# Patient Record
Sex: Female | Born: 1970 | Race: White | Hispanic: No | Marital: Single | Smoking: Never smoker
Health system: Southern US, Community
[De-identification: ages and names within clinical notes are randomized; demographics above are authoritative.]

## PROBLEM LIST (undated history)

## (undated) DIAGNOSIS — E119 Type 2 diabetes mellitus without complications: Secondary | ICD-10-CM

## (undated) DIAGNOSIS — N200 Calculus of kidney: Secondary | ICD-10-CM

## (undated) DIAGNOSIS — F329 Major depressive disorder, single episode, unspecified: Secondary | ICD-10-CM

## (undated) DIAGNOSIS — I10 Essential (primary) hypertension: Secondary | ICD-10-CM

## (undated) DIAGNOSIS — F419 Anxiety disorder, unspecified: Secondary | ICD-10-CM

## (undated) DIAGNOSIS — F32A Depression, unspecified: Secondary | ICD-10-CM

## (undated) HISTORY — DX: Major depressive disorder, single episode, unspecified: F32.9

## (undated) HISTORY — PX: KIDNEY STONE SURGERY: SHX686

## (undated) HISTORY — DX: Anxiety disorder, unspecified: F41.9

## (undated) HISTORY — DX: Depression, unspecified: F32.A

## (undated) HISTORY — PX: DILATION AND CURETTAGE OF UTERUS: SHX78

## (undated) HISTORY — DX: Essential (primary) hypertension: I10

---

## 2013-05-11 ENCOUNTER — Ambulatory Visit: Payer: Self-pay | Admitting: Emergency Medicine

## 2013-05-11 VITALS — BP 162/100 | HR 109 | Temp 100.5°F | Resp 16 | Ht 64.0 in | Wt 167.0 lb

## 2013-05-11 DIAGNOSIS — J111 Influenza due to unidentified influenza virus with other respiratory manifestations: Secondary | ICD-10-CM

## 2013-05-11 MED ORDER — PSEUDOEPHEDRINE-GUAIFENESIN ER 60-600 MG PO TB12: 1.0000 | ORAL_TABLET | Freq: Two times a day (BID) | ORAL | Status: AC

## 2013-05-11 MED ORDER — PROMETHAZINE-CODEINE 6.25-10 MG/5ML PO SYRP: 5.0000 mL | ORAL_SOLUTION | Freq: Four times a day (QID) | ORAL | Status: DC | PRN

## 2013-05-11 NOTE — Patient Instructions (Signed)

## 2013-05-11 NOTE — Progress Notes (Signed)
[facility] [address], [place] [id number] [phone]  Date:  05/11/2013   Name:  Bailey Burke   DOB:  May 24, 1970   MRN:  [id number]  PCP:  No primary provider on file.    Chief Complaint: Sore Throat, Cough, Generalized Body Aches, Chills and Hypertension   History of Present Illness:  Bailey Burke is a 43 y.o. very pleasant female patient who presents with the following:  Ill since Monday with myalgias, arthralgias, and a fever of 101.  Has a sore throat and difficulty swallowing.  No wheezing or shortness of breath.  No nausea or vomiting. No stool change or rash.  Cough is non productive.  No flu shot.  No improvement with over the counter medications or other home remedies. Denies other complaint or health concern today.   There are no active problems to display for this patient.   Past Medical History  Diagnosis Date  . Anxiety   . Depression   . Hypertension     Past Surgical History  Procedure Laterality Date  . Kidney stone surgery      History  Substance Use Topics  . Smoking status: Never Smoker   . Smokeless tobacco: Not on file  . Alcohol Use: Yes    Family History  Problem Relation Age of Onset  . Cancer Father   . Diabetes Father   . Cancer Maternal Grandmother   . Heart disease Maternal Grandfather   . Hyperlipidemia Maternal Grandfather   . Stroke Maternal Grandfather     Allergies  Allergen Reactions  . Bactrim [Sulfamethoxazole-Tmp Ds] Hives    Medication list has been reviewed and updated.  No current outpatient prescriptions on file prior to visit.   No current facility-administered medications on file prior to visit.    Review of Systems:  As per HPI, otherwise negative.    Physical Examination: Filed Vitals:   05/11/13 1009  BP: 162/100  Pulse: 109  Temp: 100.5 F (38.1 C)  Resp: 16   Filed Vitals:   05/11/13 1009  Height: 5\' 4"  (1.626 m)  Weight: 167 lb (75.751  kg)   Body mass index is 28.65 kg/(m^2). Ideal Body Weight: Weight in (lb) to have BMI = 25: 145.3  GEN: WDWN, NAD, Non-toxic, A & O x 3 HEENT: Atraumatic, Normocephalic. Neck supple. No masses, No LAD. Ears and Nose: No external deformity. CV: RRR, No M/G/R. No JVD. No thrill. No extra heart sounds. PULM: CTA B, no wheezes, crackles, rhonchi. No retractions. No resp. distress. No accessory muscle use. ABD: S, NT, ND, +BS. No rebound. No HSM. EXTR: No c/c/e NEURO Normal gait.  PSYCH: Normally interactive. Conversant. Not depressed or anxious appearing.  Calm demeanor.    Assessment and Plan: Influenza Too late for tamiflu   Signed,  Phillips OdorJeffery Yovanna Cogan, MD

## 2015-01-05 ENCOUNTER — Emergency Department (HOSPITAL_COMMUNITY): Payer: Self-pay

## 2015-01-05 ENCOUNTER — Encounter (HOSPITAL_COMMUNITY): Payer: Self-pay | Admitting: *Deleted

## 2015-01-05 ENCOUNTER — Emergency Department (HOSPITAL_COMMUNITY)
Admission: EM | Admit: 2015-01-05 | Discharge: 2015-01-05 | Disposition: A | Discharge status: Home / Self Care | Payer: Self-pay | Attending: Emergency Medicine | Admitting: Emergency Medicine

## 2015-01-05 DIAGNOSIS — Z8659 Personal history of other mental and behavioral disorders: Secondary | ICD-10-CM | POA: Insufficient documentation

## 2015-01-05 DIAGNOSIS — R112 Nausea with vomiting, unspecified: Secondary | ICD-10-CM | POA: Insufficient documentation

## 2015-01-05 DIAGNOSIS — Z3202 Encounter for pregnancy test, result negative: Secondary | ICD-10-CM | POA: Insufficient documentation

## 2015-01-05 DIAGNOSIS — R51 Headache: Secondary | ICD-10-CM | POA: Insufficient documentation

## 2015-01-05 DIAGNOSIS — Z79899 Other long term (current) drug therapy: Secondary | ICD-10-CM | POA: Insufficient documentation

## 2015-01-05 DIAGNOSIS — I10 Essential (primary) hypertension: Secondary | ICD-10-CM | POA: Insufficient documentation

## 2015-01-05 LAB — COMPREHENSIVE METABOLIC PANEL
ALBUMIN: 4 g/dL (ref 3.5–5.0)
ALT: 16 U/L (ref 14–54)
ANION GAP: 10 (ref 5–15)
AST: 18 U/L (ref 15–41)
Alkaline Phosphatase: 76 U/L (ref 38–126)
BUN: 15 mg/dL (ref 6–20)
CO2: 24 mmol/L (ref 22–32)
Calcium: 9.2 mg/dL (ref 8.9–10.3)
Chloride: 103 mmol/L (ref 101–111)
Creatinine, Ser: 0.97 mg/dL (ref 0.44–1.00)
GFR calc Af Amer: >60 mL/min (ref >60)
GFR calc non Af Amer: >60 mL/min (ref >60)
GLUCOSE: 112 mg/dL — AB (ref 65–99)
POTASSIUM: 3.7 mmol/L (ref 3.5–5.1)
SODIUM: 137 mmol/L (ref 135–145)
TOTAL PROTEIN: 7.4 g/dL (ref 6.5–8.1)
Total Bilirubin: 1.1 mg/dL (ref 0.3–1.2)

## 2015-01-05 LAB — URINALYSIS, ROUTINE W REFLEX MICROSCOPIC
Bilirubin Urine: NEGATIVE
Glucose, UA: NEGATIVE mg/dL
HGB URINE DIPSTICK: NEGATIVE
Ketones, ur: >80 mg/dL — AB
Nitrite: POSITIVE — AB
Protein, ur: NEGATIVE mg/dL
SPECIFIC GRAVITY, URINE: 1.022 (ref 1.005–1.030)
UROBILINOGEN UA: 0.2 mg/dL (ref 0.0–1.0)
pH: 5.5 (ref 5.0–8.0)

## 2015-01-05 LAB — LIPASE, BLOOD: LIPASE: 17 U/L (ref 11–51)

## 2015-01-05 LAB — URINE MICROSCOPIC-ADD ON

## 2015-01-05 LAB — CBC
HEMATOCRIT: 42.1 % (ref 36.0–46.0)
HEMOGLOBIN: 14.6 g/dL (ref 12.0–15.0)
MCH: 29 pg (ref 26.0–34.0)
MCHC: 34.7 g/dL (ref 30.0–36.0)
MCV: 83.5 fL (ref 78.0–100.0)
Platelets: 295 10*3/uL (ref 150–400)
RBC: 5.04 MIL/uL (ref 3.87–5.11)
RDW: 13 % (ref 11.5–15.5)
WBC: 9.6 10*3/uL (ref 4.0–10.5)

## 2015-01-05 LAB — RAPID URINE DRUG SCREEN, HOSP PERFORMED
Amphetamines: NOT DETECTED
BARBITURATES: NOT DETECTED
BENZODIAZEPINES: NOT DETECTED
COCAINE: NOT DETECTED
Opiates: NOT DETECTED
Tetrahydrocannabinol: NOT DETECTED

## 2015-01-05 LAB — TROPONIN I: Troponin I: 0.32 ng/mL — ABNORMAL HIGH (ref <0.031)

## 2015-01-05 LAB — PREGNANCY, URINE: PREG TEST UR: NEGATIVE

## 2015-01-05 MED ORDER — SODIUM CHLORIDE 0.9 % IV BOLUS (SEPSIS)
1000.0000 mL | Freq: Once | INTRAVENOUS | Status: AC
  Administered 2015-01-05: 1000 mL via INTRAVENOUS

## 2015-01-05 MED ORDER — CARVEDILOL 12.5 MG PO TABS: 12.5000 mg | ORAL_TABLET | Freq: Two times a day (BID) | ORAL | Status: DC

## 2015-01-05 MED ORDER — HYDROCHLOROTHIAZIDE 25 MG PO TABS
25.0000 mg | ORAL_TABLET | Freq: Every day | ORAL | Status: DC
  Administered 2015-01-05: 25 mg via ORAL
  Filled 2015-01-05: qty 1

## 2015-01-05 MED ORDER — CARVEDILOL 12.5 MG PO TABS
12.5000 mg | ORAL_TABLET | Freq: Two times a day (BID) | ORAL | Status: DC
  Administered 2015-01-05: 12.5 mg via ORAL
  Filled 2015-01-05 (×2): qty 1

## 2015-01-05 MED ORDER — ONDANSETRON HCL 4 MG/2ML IJ SOLN
4.0000 mg | Freq: Once | INTRAMUSCULAR | Status: AC
  Administered 2015-01-05: 4 mg via INTRAVENOUS
  Filled 2015-01-05: qty 2

## 2015-01-05 MED ORDER — ONDANSETRON 8 MG PO TBDP: 8.0000 mg | ORAL_TABLET | Freq: Three times a day (TID) | ORAL | Status: DC | PRN

## 2015-01-05 NOTE — ED Notes (Signed)
[facility] for chest pressure and right arm pain.  [facility] and was [facility].  Pt reports that at 0830 she began to feel nauseous with numerous episodes of vomiting throughout the day.  Pt denies any chest pain at this time.  Pt given 50mcg Fentanyl PTA.

## 2015-01-05 NOTE — ED Notes (Signed)
MD at bedside. 

## 2015-01-05 NOTE — ED Notes (Signed)
Pt received 4mg  of zofran en route

## 2015-01-05 NOTE — ED Provider Notes (Signed)
CSN: [id number]     Arrival date & time 01/05/15  1504 History   First MD Initiated Contact with Patient 01/05/15 1522     Chief Complaint  Patient presents with  . Emesis     (Consider location/radiation/quality/duration/timing/severity/associated sxs/prior Treatment) Patient is a 44 y.o. female presenting with vomiting. The history is provided by the patient.  Emesis Associated symptoms: headaches   Associated symptoms: no abdominal pain, no chills and no sore throat   Patient w rhx hypertension, c/o recent admission at [facility] for severe hypertension, chest pain and sob.  Pt states upon admission there her bp was 210/130.  During admission there, her troponin was elevated. Pt indicates was felt to have hypertensive emergency.  Pt was d/c'd to home yesterday.  pts mom indicates strong fam hx cad in 40's and 50's.   Pt indicates previous to this admission, in past, had been on hctz, but hadnt been on recently. Pt notes episodes nv, starting while still in hospital at [facility].  Has vomiting several times today, and hasnt been able to take her meds (hctz and carvedilol).  Pt c/o gradual onset dull headache for the past 3 days. Was mod-severe, but now only mild. Constant. No acute or abrupt change today. Pt denies sinus drainage or congestion. No cough or sob. No diarrhea. Or constipation.  No known ill contacts (although was recently in hospital).  No fever or chills.      Past Medical History  Diagnosis Date  . Anxiety   . Depression   . Hypertension    Past Surgical History  Procedure Laterality Date  . Kidney stone surgery     Family History  Problem Relation Age of Onset  . Cancer Father   . Diabetes Father   . Cancer Maternal Grandmother   . Heart disease Maternal Grandfather   . Hyperlipidemia Maternal Grandfather   . Stroke Maternal Grandfather    Social History  Substance Use Topics  . Smoking status: Never Smoker   . Smokeless tobacco: None  . Alcohol Use: Yes   OB  History    No data available     Review of Systems  Constitutional: Negative for fever and chills.  HENT: Negative for congestion, sinus pressure and sore throat.   Eyes: Negative for visual disturbance.  Respiratory: Negative for cough and shortness of breath.   Cardiovascular: Negative for chest pain and leg swelling.  Gastrointestinal: Positive for vomiting. Negative for abdominal pain.  Genitourinary: Negative for flank pain.  Musculoskeletal: Negative for back pain, neck pain and neck stiffness.  Skin: Negative for rash.  Neurological: Positive for headaches. Negative for weakness and numbness.  Hematological: Does not bruise/bleed easily.  Psychiatric/Behavioral: Negative for confusion.      Allergies  Bactrim  Home Medications   Prior to Admission medications   Medication Sig Start Date End Date Taking? Authorizing Provider  hydrochlorothiazide (HYDRODIURIL) 25 MG tablet Take 25 mg by mouth daily.   Yes Historical Provider, MD  amphetamine-dextroamphetamine (ADDERALL XR) 20 MG 24 hr capsule Take 20 mg by mouth daily.    Historical Provider, MD  promethazine-codeine (PHENERGAN WITH CODEINE) 6.25-10 MG/5ML syrup Take 5-10 mLs by mouth every 6 (six) hours as needed. Patient not taking: Reported on 01/05/2015 05/11/13   Carmelina Dane, MD   SpO2 96%  LMP 12/14/2014 Physical Exam  Constitutional: She is oriented to person, place, and time. She appears well-developed and well-nourished. No distress.  HENT:  Head: Atraumatic.  Mouth/Throat: Oropharynx is clear  and moist.  No sinus or temporal tenderness.   Eyes: Conjunctivae are normal. Pupils are equal, round, and reactive to light. No scleral icterus.  Neck: Neck supple. No tracheal deviation present. No thyromegaly present.  No stiffness or rigidity  Cardiovascular: Normal rate, regular rhythm, normal heart sounds and intact distal pulses.  Exam reveals no gallop and no friction rub.   No murmur  heard. Pulmonary/Chest: Effort normal and breath sounds normal. No respiratory distress.  Abdominal: Soft. Normal appearance and bowel sounds are normal. She exhibits no distension. There is no tenderness. There is no rebound and no guarding.  Genitourinary:  No cva tenderness  Musculoskeletal: She exhibits no edema or tenderness.  Neurological: She is alert and oriented to person, place, and time.  Skin: Skin is warm and dry. No rash noted. She is not diaphoretic.  Psychiatric: She has a normal mood and affect.  Nursing note and vitals reviewed.   ED Course  Procedures (including critical care time) Labs Review  Results for orders placed or performed during the hospital encounter of 01/05/15  Lipase, blood  Result Value Ref Range   Lipase 17 11 - 51 U/L  Comprehensive metabolic panel  Result Value Ref Range   Sodium 137 135 - 145 mmol/L   Potassium 3.7 3.5 - 5.1 mmol/L   Chloride 103 101 - 111 mmol/L   CO2 24 22 - 32 mmol/L   Glucose, Bld 112 (H) 65 - 99 mg/dL   BUN 15 6 - 20 mg/dL   Creatinine, Ser 5.400.97 0.44 - 1.00 mg/dL   Calcium 9.2 8.9 - 98.110.3 mg/dL   Total Protein 7.4 6.5 - 8.1 g/dL   Albumin 4.0 3.5 - 5.0 g/dL   AST 18 15 - 41 U/L   ALT 16 14 - 54 U/L   Alkaline Phosphatase 76 38 - 126 U/L   Total Bilirubin 1.1 0.3 - 1.2 mg/dL   GFR calc non Af Amer >60 >60 mL/min   GFR calc Af Amer >60 >60 mL/min   Anion gap 10 5 - 15  CBC  Result Value Ref Range   WBC 9.6 4.0 - 10.5 K/uL   RBC 5.04 3.87 - 5.11 MIL/uL   Hemoglobin 14.6 12.0 - 15.0 g/dL   HCT 19.142.1 47.836.0 - 29.546.0 %   MCV 83.5 78.0 - 100.0 fL   MCH 29.0 26.0 - 34.0 pg   MCHC 34.7 30.0 - 36.0 g/dL   RDW 62.113.0 30.811.5 - 65.715.5 %   Platelets 295 150 - 400 K/uL  Pregnancy, urine  Result Value Ref Range   Preg Test, Ur NEGATIVE NEGATIVE  Troponin I  Result Value Ref Range   Troponin I 0.32 (H) <0.031 ng/mL   Ct Head Wo Contrast  01/05/2015  CLINICAL DATA:  Pain.  Nausea and vomiting. EXAM: CT HEAD WITHOUT CONTRAST  TECHNIQUE: Contiguous axial images were obtained from the base of the skull through the vertex without intravenous contrast. COMPARISON:  None. FINDINGS: No evidence of parenchymal hemorrhage or extra-axial fluid collection. No mass lesion, mass effect, or midline shift. No CT evidence of acute infarction. Cerebral volume is age appropriate. No ventriculomegaly. The visualized paranasal sinuses are essentially clear. Mild partial opacification of posterior right mastoid air cells. The left mastoid air cells are unopacified. No evidence of calvarial fracture. IMPRESSION: 1.  No evidence of acute intracranial abnormality. 2. Mild partial opacification of the posterior right mastoid air cells, nonspecific. Electronically Signed   By: Delbert PhenixJason A Poff M.D.   On:  01/05/2015 17:29      I have personally reviewed and evaluated these images and lab results as part of my medical decision-making.   EKG Interpretation   Date/Time:  Saturday January 05 2015 15:42:39 EDT Ventricular Rate:  87 PR Interval:  145 QRS Duration: 94 QT Interval:  393 QTC Calculation: 473 R Axis:   55 Text Interpretation:  Sinus rhythm `no acute st/t changes No previous  tracing Confirmed by Denton Lank  MD, Caryn Bee ([id number]) on 01/05/2015 4:32:49 PM      MDM   Iv ns bolus. zofran iv. Labs.  Reviewed nursing notes and prior charts for additional history.   Reviewed recent admission from [facility] - pt with troponins in range 2.0 there, much less today.  During [facility] admission, pts elevated troponins felt to be related to severe hypertension - pt had cardiology eval during her inpatient stay and has f/u in the next couple weeks.  Pt feels much improved post ivf.  nv resolved. No abd pain. Pt tolerating po fluids, and given her todays dose of her bp meds which she was yet to have.  Pt denies any current chest pain or discomfort. No sob.   Pt indicates lives in [place], and would like referral to cardiology here as well.  Pt  continues to tolerating po.  No abd pain or nv. No chest pain, sob or unusual doe.  Pt currently appears stable for d/c.  Return precautions provided.     Cathren Laine, MD 01/05/15 [phone]

## 2015-01-05 NOTE — Discharge Instructions (Signed)
It was our pleasure to provide your ER care today - we hope that you feel better.  Rest. Drink plenty of fluids.  Take zofran as need for nausea.  Given troponin elevation, and family history of early heart disease, follow up closely with cardiologist in the coming week -  Discuss possible stress testing/further evaluation with them.  Continue your blood pressure medication.  Follow up for recheck this week.  Return to ER if worse, persistent vomiting, new symptoms, chest pain, trouble breathing, fevers, other concern.    Nausea and Vomiting Nausea is a sick feeling that often comes before throwing up (vomiting). Vomiting is a reflex where stomach contents come out of your mouth. Vomiting can cause severe loss of body fluids (dehydration). Children and elderly adults can become dehydrated quickly, especially if they also have diarrhea. Nausea and vomiting are symptoms of a condition or disease. It is important to find the cause of your symptoms. CAUSES   Direct irritation of the stomach lining. This irritation can result from increased acid production (gastroesophageal reflux disease), infection, food poisoning, taking certain medicines (such as nonsteroidal anti-inflammatory drugs), alcohol use, or tobacco use.  Signals from the brain.These signals could be caused by a headache, heat exposure, an inner ear disturbance, increased pressure in the brain from injury, infection, a tumor, or a concussion, pain, emotional stimulus, or metabolic problems.  An obstruction in the gastrointestinal tract (bowel obstruction).  Illnesses such as diabetes, hepatitis, gallbladder problems, appendicitis, kidney problems, cancer, sepsis, atypical symptoms of a heart attack, or eating disorders.  Medical treatments such as chemotherapy and radiation.  Receiving medicine that makes you sleep (general anesthetic) during surgery. DIAGNOSIS Your caregiver may ask for tests to be done if the problems do not  improve after a few days. Tests may also be done if symptoms are severe or if the reason for the nausea and vomiting is not clear. Tests may include:  Urine tests.  Blood tests.  Stool tests.  Cultures (to look for evidence of infection).  X-rays or other imaging studies. Test results can help your caregiver make decisions about treatment or the need for additional tests. TREATMENT You need to stay well hydrated. Drink frequently but in small amounts.You may wish to drink water, sports drinks, clear broth, or eat frozen ice pops or gelatin dessert to help stay hydrated.When you eat, eating slowly may help prevent nausea.There are also some antinausea medicines that may help prevent nausea. HOME CARE INSTRUCTIONS   Take all medicine as directed by your caregiver.  If you do not have an appetite, do not force yourself to eat. However, you must continue to drink fluids.  If you have an appetite, eat a normal diet unless your caregiver tells you differently.  Eat a variety of complex carbohydrates (rice, wheat, potatoes, bread), lean meats, yogurt, fruits, and vegetables.  Avoid high-fat foods because they are more difficult to digest.  Drink enough water and fluids to keep your urine clear or pale yellow.  If you are dehydrated, ask your caregiver for specific rehydration instructions. Signs of dehydration may include:  Severe thirst.  Dry lips and mouth.  Dizziness.  Dark urine.  Decreasing urine frequency and amount.  Confusion.  Rapid breathing or pulse. SEEK IMMEDIATE MEDICAL CARE IF:   You have blood or brown flecks (like coffee grounds) in your vomit.  You have black or bloody stools.  You have a severe headache or stiff neck.  You are confused.  You have severe abdominal  pain.  You have chest pain or trouble breathing.  You do not urinate at least once every 8 hours.  You develop cold or clammy skin.  You continue to vomit for longer than 24 to 48  hours.  You have a fever. MAKE SURE YOU:   Understand these instructions.  Will watch your condition.  Will get help right away if you are not doing well or get worse.   This information is not intended to replace advice given to you by your health care provider. Make sure you discuss any questions you have with your health care provider.   Document Released: 02/16/2005 Document Revised: 05/11/2011 Document Reviewed: 07/16/2010 Elsevier Interactive Patient Education 2016 [employer].    Hypertension Hypertension, commonly called high blood pressure, is when the force of blood pumping through your arteries is too strong. Your arteries are the blood vessels that carry blood from your heart throughout your body. A blood pressure reading consists of a higher number over a lower number, such as 110/72. The higher number (systolic) is the pressure inside your arteries when your heart pumps. The lower number (diastolic) is the pressure inside your arteries when your heart relaxes. Ideally you want your blood pressure below 120/80. Hypertension forces your heart to work harder to pump blood. Your arteries may become narrow or stiff. Having untreated or uncontrolled hypertension can cause heart attack, stroke, kidney disease, and other problems. RISK FACTORS Some risk factors for high blood pressure are controllable. Others are not.  Risk factors you cannot control include:   Race. You may be at higher risk if you are African American.  Age. Risk increases with age.  Gender. Men are at higher risk than women before age 67 years. After age 55, women are at higher risk than men. Risk factors you can control include:  Not getting enough exercise or physical activity.  Being overweight.  Getting too much fat, sugar, calories, or salt in your diet.  Drinking too much alcohol. SIGNS AND SYMPTOMS Hypertension does not usually cause signs or symptoms. Extremely high blood pressure  (hypertensive crisis) may cause headache, anxiety, shortness of breath, and nosebleed. DIAGNOSIS To check if you have hypertension, your health care provider will measure your blood pressure while you are seated, with your arm held at the level of your heart. It should be measured at least twice using the same arm. Certain conditions can cause a difference in blood pressure between your right and left arms. A blood pressure reading that is higher than normal on one occasion does not mean that you need treatment. If it is not clear whether you have high blood pressure, you may be asked to return on a different day to have your blood pressure checked again. Or, you may be asked to monitor your blood pressure at home for 1 or more weeks. TREATMENT Treating high blood pressure includes making lifestyle changes and possibly taking medicine. Living a healthy lifestyle can help lower high blood pressure. You may need to change some of your habits. Lifestyle changes may include:  Following the DASH diet. This diet is high in fruits, vegetables, and whole grains. It is low in salt, red meat, and added sugars.  Keep your sodium intake below 2,300 mg per day.  Getting at least 30-45 minutes of aerobic exercise at least 4 times per week.  Losing weight if necessary.  Not smoking.  Limiting alcoholic beverages.  Learning ways to reduce stress. Your health care provider may prescribe medicine  if lifestyle changes are not enough to get your blood pressure under control, and if one of the following is true:  You are 5418-42 years of age and your systolic blood pressure is above 140.  You are 42 years of age or older, and your systolic blood pressure is above 150.  Your diastolic blood pressure is above 90.  You have diabetes, and your systolic blood pressure is over 140 or your diastolic blood pressure is over 90.  You have kidney disease and your blood pressure is above 140/90.  You have heart disease  and your blood pressure is above 140/90. Your personal target blood pressure may vary depending on your medical conditions, your age, and other factors. HOME CARE INSTRUCTIONS  Have your blood pressure rechecked as directed by your health care provider.   Take medicines only as directed by your health care provider. Follow the directions carefully. Blood pressure medicines must be taken as prescribed. The medicine does not work as well when you skip doses. Skipping doses also puts you at risk for problems.  Do not smoke.   Monitor your blood pressure at home as directed by your health care provider. SEEK MEDICAL CARE IF:   You think you are having a reaction to medicines taken.  You have recurrent headaches or feel dizzy.  You have swelling in your ankles.  You have trouble with your vision. SEEK IMMEDIATE MEDICAL CARE IF:  You develop a severe headache or confusion.  You have unusual weakness, numbness, or feel faint.  You have severe chest or abdominal pain.  You vomit repeatedly.  You have trouble breathing. MAKE SURE YOU:   Understand these instructions.  Will watch your condition.  Will get help right away if you are not doing well or get worse.   This information is not intended to replace advice given to you by your health care provider. Make sure you discuss any questions you have with your health care provider.   Document Released: 02/16/2005 Document Revised: 07/03/2014 Document Reviewed: 12/09/2012 Elsevier Interactive Patient Education [employer]2016 Elsevier Inc.

## 2015-03-27 DIAGNOSIS — I10 Essential (primary) hypertension: Secondary | ICD-10-CM | POA: Insufficient documentation

## 2016-07-16 IMAGING — CT CT HEAD W/O CM
2 series · 15 of 30 positions shown, 17 images · non-contrast
Comparison: None.

CLINICAL DATA: Pain.  Nausea and vomiting.

EXAM:
CT HEAD WITHOUT CONTRAST
TECHNIQUE: Contiguous axial images were obtained from the base of the skull
through the vertex without intravenous contrast.

[Series 2: head without · axial · non-contrast · 0.41mm/px · z∈[-169,-49]mm · 7 of 32 slices shown, 9 images]
[im 4/32  brain]
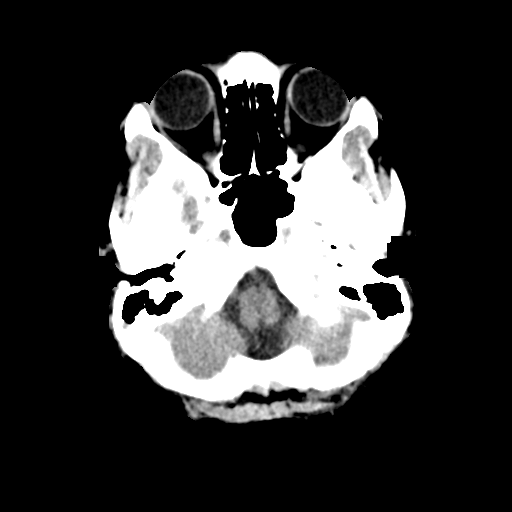
[im 4/32  bone]
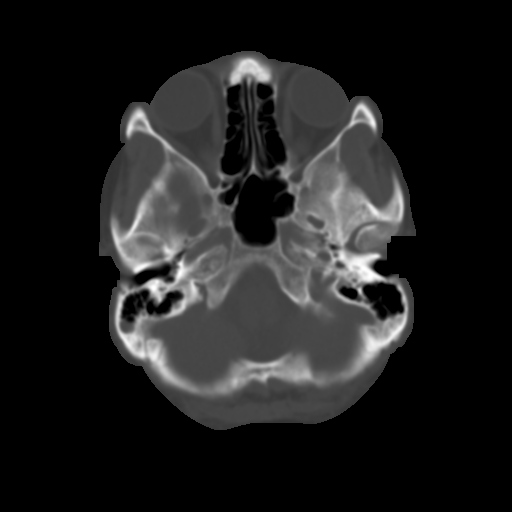
[im 8/32  brain]
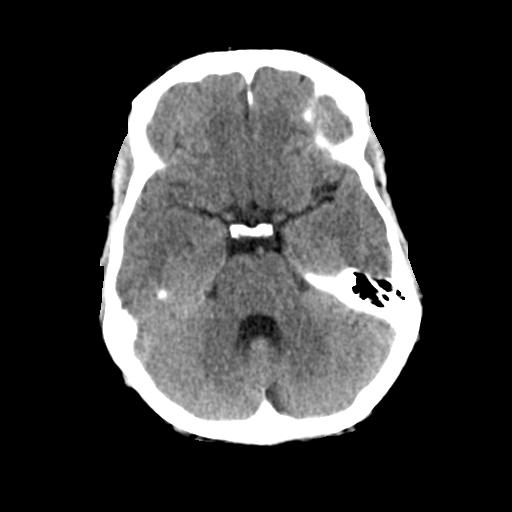
[im 12/32  brain]
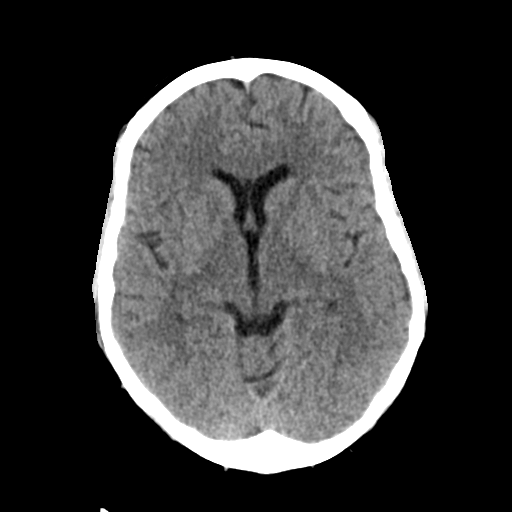
[im 16/32  brain]
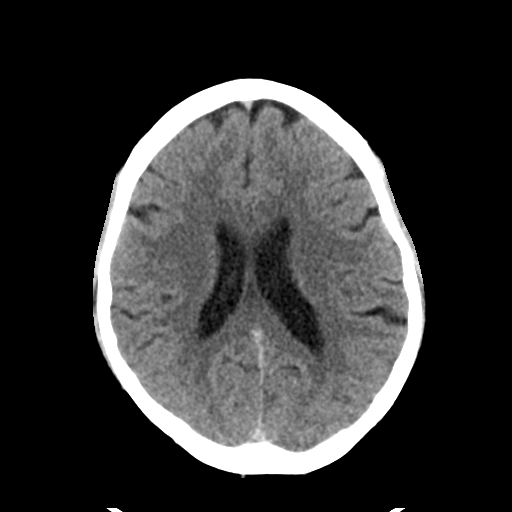
[im 20/32  brain]
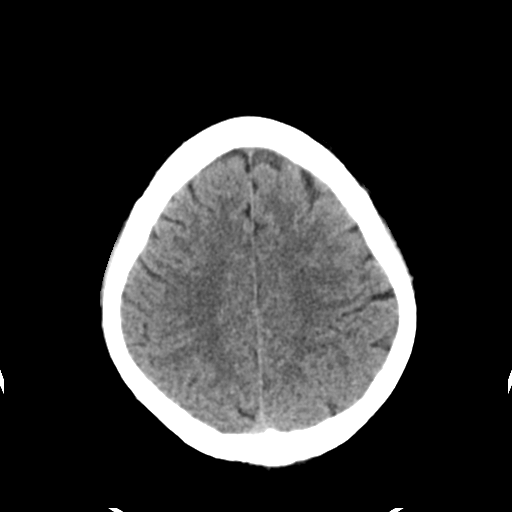
[im 20/32  bone]
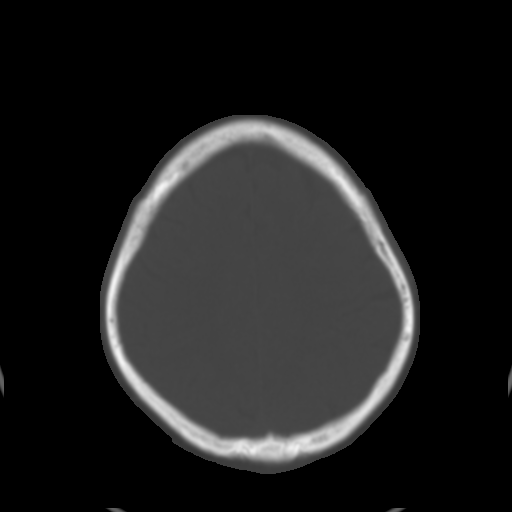
[im 24/32  brain]
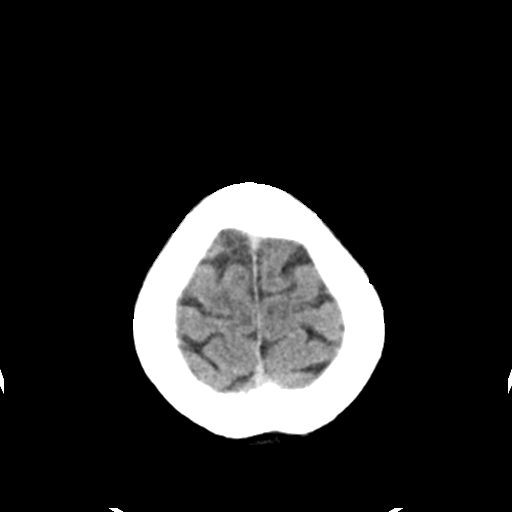
[im 28/32  brain]
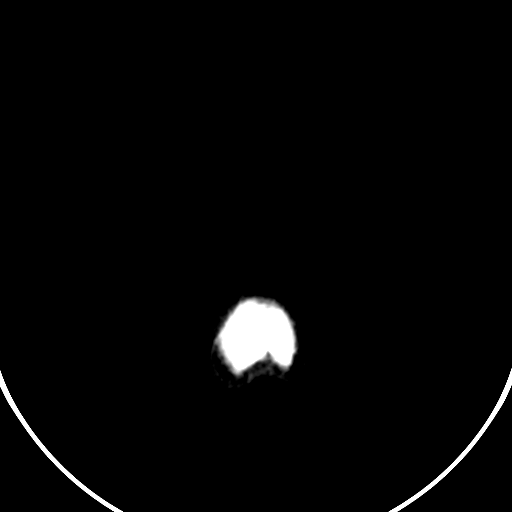

[Series 3: head bone · axial · 0.41mm/px · z∈[-170,-46]mm · 8 of 78 slices shown]
[im 8/78  bone]
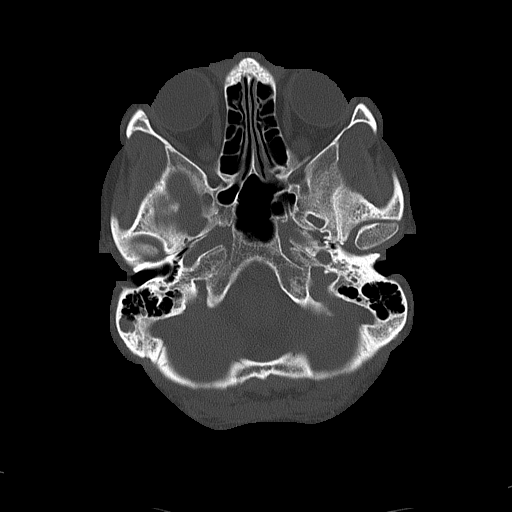
[im 16/78  bone]
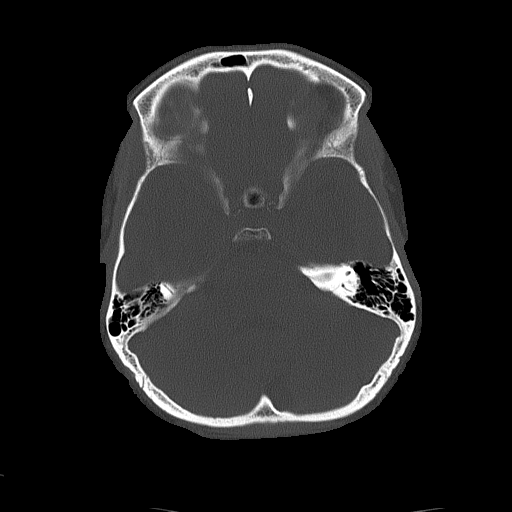
[im 24/78  bone]
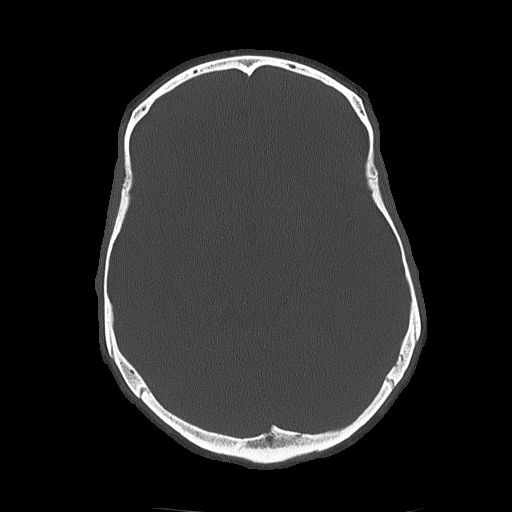
[im 35/78  bone]
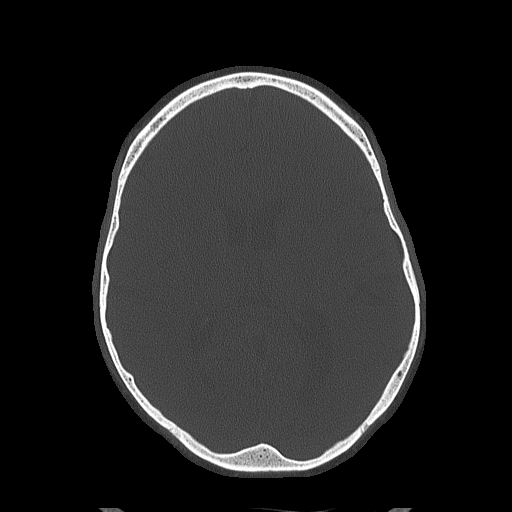
[im 43/78  bone]
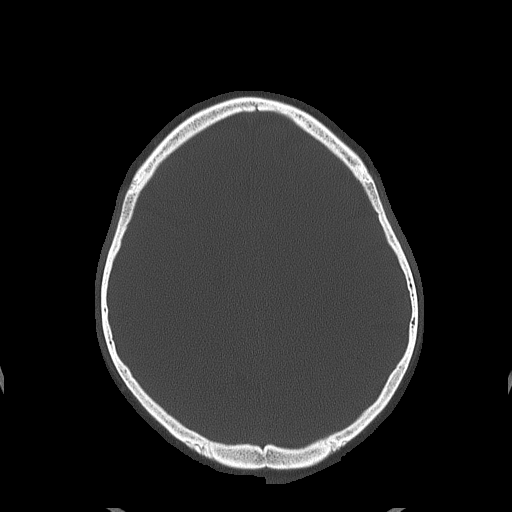
[im 54/78  bone]
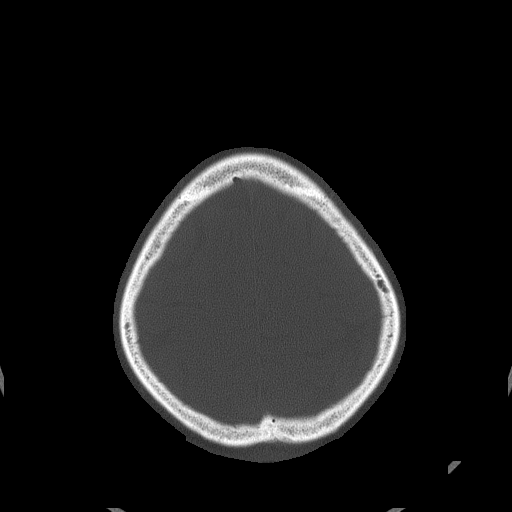
[im 62/78  bone]
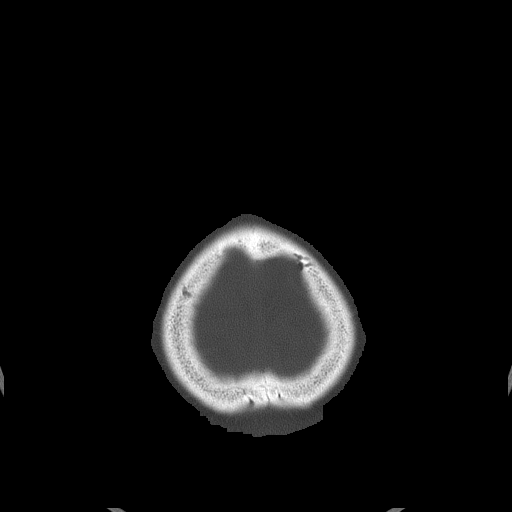
[im 70/78  bone]
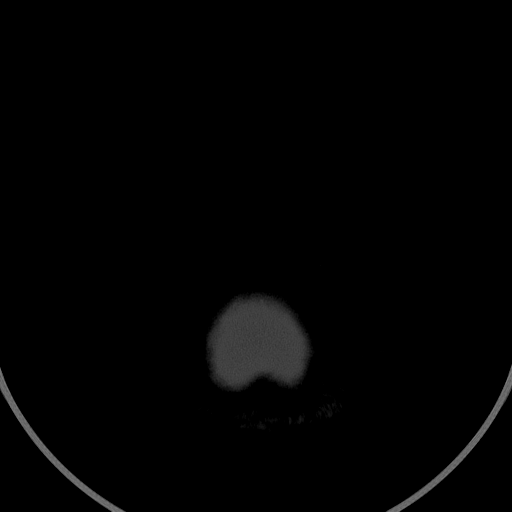

[15 of 30 positions shown; findings below may reference images not displayed]

FINDINGS: No evidence of parenchymal hemorrhage or extra-axial fluid
collection. No mass lesion, mass effect, or midline shift.

No CT evidence of acute infarction.

Cerebral volume is age appropriate. No ventriculomegaly.

The visualized paranasal sinuses are essentially clear. Mild partial
opacification of posterior right mastoid air cells. The left mastoid
air cells are unopacified. No evidence of calvarial fracture.
IMPRESSION: 1.  No evidence of acute intracranial abnormality.
2. Mild partial opacification of the posterior right mastoid air
cells, nonspecific.

## 2019-04-24 ENCOUNTER — Ambulatory Visit: Payer: Self-pay | Admitting: Professional

## 2019-05-01 DIAGNOSIS — F419 Anxiety disorder, unspecified: Secondary | ICD-10-CM | POA: Insufficient documentation

## 2019-05-01 DIAGNOSIS — E782 Mixed hyperlipidemia: Secondary | ICD-10-CM | POA: Insufficient documentation

## 2019-05-01 DIAGNOSIS — F900 Attention-deficit hyperactivity disorder, predominantly inattentive type: Secondary | ICD-10-CM | POA: Insufficient documentation

## 2019-05-22 ENCOUNTER — Ambulatory Visit (INDEPENDENT_AMBULATORY_CARE_PROVIDER_SITE_OTHER): Payer: PRIVATE HEALTH INSURANCE | Admitting: Professional

## 2019-05-22 DIAGNOSIS — F411 Generalized anxiety disorder: Secondary | ICD-10-CM | POA: Diagnosis not present

## 2019-05-22 DIAGNOSIS — F900 Attention-deficit hyperactivity disorder, predominantly inattentive type: Secondary | ICD-10-CM

## 2019-05-22 DIAGNOSIS — F331 Major depressive disorder, recurrent, moderate: Secondary | ICD-10-CM | POA: Diagnosis not present

## 2019-06-13 ENCOUNTER — Ambulatory Visit (INDEPENDENT_AMBULATORY_CARE_PROVIDER_SITE_OTHER): Payer: PRIVATE HEALTH INSURANCE | Admitting: Professional

## 2019-06-13 DIAGNOSIS — F331 Major depressive disorder, recurrent, moderate: Secondary | ICD-10-CM

## 2019-06-13 DIAGNOSIS — F411 Generalized anxiety disorder: Secondary | ICD-10-CM

## 2019-06-13 DIAGNOSIS — F900 Attention-deficit hyperactivity disorder, predominantly inattentive type: Secondary | ICD-10-CM

## 2019-06-27 ENCOUNTER — Ambulatory Visit: Payer: PRIVATE HEALTH INSURANCE | Admitting: Professional

## 2019-07-05 DIAGNOSIS — Z780 Asymptomatic menopausal state: Secondary | ICD-10-CM | POA: Insufficient documentation

## 2019-07-10 ENCOUNTER — Ambulatory Visit (INDEPENDENT_AMBULATORY_CARE_PROVIDER_SITE_OTHER): Payer: PRIVATE HEALTH INSURANCE | Admitting: Professional

## 2019-07-10 DIAGNOSIS — F900 Attention-deficit hyperactivity disorder, predominantly inattentive type: Secondary | ICD-10-CM

## 2019-07-10 DIAGNOSIS — F411 Generalized anxiety disorder: Secondary | ICD-10-CM | POA: Diagnosis not present

## 2019-07-10 DIAGNOSIS — F331 Major depressive disorder, recurrent, moderate: Secondary | ICD-10-CM

## 2019-07-24 ENCOUNTER — Ambulatory Visit (INDEPENDENT_AMBULATORY_CARE_PROVIDER_SITE_OTHER): Payer: PRIVATE HEALTH INSURANCE | Admitting: Professional

## 2019-07-24 DIAGNOSIS — F411 Generalized anxiety disorder: Secondary | ICD-10-CM | POA: Diagnosis not present

## 2019-07-24 DIAGNOSIS — F900 Attention-deficit hyperactivity disorder, predominantly inattentive type: Secondary | ICD-10-CM | POA: Diagnosis not present

## 2019-07-24 DIAGNOSIS — F331 Major depressive disorder, recurrent, moderate: Secondary | ICD-10-CM | POA: Diagnosis not present

## 2019-08-07 ENCOUNTER — Ambulatory Visit (INDEPENDENT_AMBULATORY_CARE_PROVIDER_SITE_OTHER): Payer: PRIVATE HEALTH INSURANCE | Admitting: Professional

## 2019-08-07 DIAGNOSIS — F900 Attention-deficit hyperactivity disorder, predominantly inattentive type: Secondary | ICD-10-CM | POA: Diagnosis not present

## 2019-08-07 DIAGNOSIS — F331 Major depressive disorder, recurrent, moderate: Secondary | ICD-10-CM | POA: Diagnosis not present

## 2019-08-07 DIAGNOSIS — F411 Generalized anxiety disorder: Secondary | ICD-10-CM

## 2019-08-18 ENCOUNTER — Ambulatory Visit: Payer: PRIVATE HEALTH INSURANCE | Admitting: Professional

## 2019-08-23 ENCOUNTER — Ambulatory Visit (INDEPENDENT_AMBULATORY_CARE_PROVIDER_SITE_OTHER): Payer: PRIVATE HEALTH INSURANCE | Admitting: Professional

## 2019-08-23 DIAGNOSIS — F411 Generalized anxiety disorder: Secondary | ICD-10-CM

## 2019-08-23 DIAGNOSIS — F900 Attention-deficit hyperactivity disorder, predominantly inattentive type: Secondary | ICD-10-CM | POA: Diagnosis not present

## 2019-08-23 DIAGNOSIS — F331 Major depressive disorder, recurrent, moderate: Secondary | ICD-10-CM | POA: Diagnosis not present

## 2019-09-06 ENCOUNTER — Ambulatory Visit (INDEPENDENT_AMBULATORY_CARE_PROVIDER_SITE_OTHER): Payer: PRIVATE HEALTH INSURANCE | Admitting: Professional

## 2019-09-06 DIAGNOSIS — F900 Attention-deficit hyperactivity disorder, predominantly inattentive type: Secondary | ICD-10-CM | POA: Diagnosis not present

## 2019-09-06 DIAGNOSIS — F331 Major depressive disorder, recurrent, moderate: Secondary | ICD-10-CM

## 2019-09-06 DIAGNOSIS — F411 Generalized anxiety disorder: Secondary | ICD-10-CM | POA: Diagnosis not present

## 2019-09-14 DIAGNOSIS — E669 Obesity, unspecified: Secondary | ICD-10-CM | POA: Insufficient documentation

## 2019-09-15 ENCOUNTER — Ambulatory Visit: Payer: PRIVATE HEALTH INSURANCE | Admitting: Professional

## 2019-09-20 ENCOUNTER — Ambulatory Visit (INDEPENDENT_AMBULATORY_CARE_PROVIDER_SITE_OTHER): Payer: PRIVATE HEALTH INSURANCE | Admitting: Professional

## 2019-09-20 DIAGNOSIS — F900 Attention-deficit hyperactivity disorder, predominantly inattentive type: Secondary | ICD-10-CM | POA: Diagnosis not present

## 2019-09-20 DIAGNOSIS — F411 Generalized anxiety disorder: Secondary | ICD-10-CM

## 2019-09-20 DIAGNOSIS — F331 Major depressive disorder, recurrent, moderate: Secondary | ICD-10-CM | POA: Diagnosis not present

## 2019-10-04 ENCOUNTER — Ambulatory Visit (INDEPENDENT_AMBULATORY_CARE_PROVIDER_SITE_OTHER): Payer: PRIVATE HEALTH INSURANCE | Admitting: Professional

## 2019-10-04 DIAGNOSIS — F331 Major depressive disorder, recurrent, moderate: Secondary | ICD-10-CM | POA: Diagnosis not present

## 2019-10-04 DIAGNOSIS — F900 Attention-deficit hyperactivity disorder, predominantly inattentive type: Secondary | ICD-10-CM | POA: Diagnosis not present

## 2019-10-04 DIAGNOSIS — F411 Generalized anxiety disorder: Secondary | ICD-10-CM

## 2019-10-18 ENCOUNTER — Ambulatory Visit: Payer: PRIVATE HEALTH INSURANCE | Admitting: Professional

## 2019-11-08 ENCOUNTER — Ambulatory Visit: Payer: PRIVATE HEALTH INSURANCE | Admitting: Professional

## 2019-11-15 ENCOUNTER — Ambulatory Visit: Payer: PRIVATE HEALTH INSURANCE | Admitting: Professional

## 2019-11-23 ENCOUNTER — Ambulatory Visit (INDEPENDENT_AMBULATORY_CARE_PROVIDER_SITE_OTHER): Payer: PRIVATE HEALTH INSURANCE | Admitting: Professional

## 2019-11-23 DIAGNOSIS — F331 Major depressive disorder, recurrent, moderate: Secondary | ICD-10-CM | POA: Diagnosis not present

## 2019-11-23 DIAGNOSIS — F411 Generalized anxiety disorder: Secondary | ICD-10-CM | POA: Diagnosis not present

## 2021-03-28 DIAGNOSIS — F329 Major depressive disorder, single episode, unspecified: Secondary | ICD-10-CM | POA: Insufficient documentation

## 2023-02-16 ENCOUNTER — Ambulatory Visit (INDEPENDENT_AMBULATORY_CARE_PROVIDER_SITE_OTHER): Payer: PRIVATE HEALTH INSURANCE

## 2023-02-16 ENCOUNTER — Ambulatory Visit
Admission: EM | Admit: 2023-02-16 | Discharge: 2023-02-16 | Disposition: A | Discharge status: Home / Self Care | Payer: PRIVATE HEALTH INSURANCE | Attending: Internal Medicine | Admitting: Internal Medicine

## 2023-02-16 DIAGNOSIS — N3001 Acute cystitis with hematuria: Secondary | ICD-10-CM | POA: Insufficient documentation

## 2023-02-16 DIAGNOSIS — M5441 Lumbago with sciatica, right side: Secondary | ICD-10-CM | POA: Diagnosis not present

## 2023-02-16 DIAGNOSIS — Z79899 Other long term (current) drug therapy: Secondary | ICD-10-CM | POA: Insufficient documentation

## 2023-02-16 DIAGNOSIS — M5442 Lumbago with sciatica, left side: Secondary | ICD-10-CM

## 2023-02-16 DIAGNOSIS — R300 Dysuria: Secondary | ICD-10-CM | POA: Diagnosis not present

## 2023-02-16 HISTORY — DX: Calculus of kidney: N20.0

## 2023-02-16 HISTORY — DX: Type 2 diabetes mellitus without complications: E11.9

## 2023-02-16 LAB — POCT URINALYSIS DIP (MANUAL ENTRY)
Bilirubin, UA: NEGATIVE
Glucose, UA: NEGATIVE mg/dL
Ketones, POC UA: NEGATIVE mg/dL
Nitrite, UA: NEGATIVE
Protein Ur, POC: 30 mg/dL — AB
Spec Grav, UA: 1.02 (ref 1.010–1.025)
Urobilinogen, UA: 0.2 U/dL
pH, UA: 6.5 (ref 5.0–8.0)

## 2023-02-16 MED ORDER — CYCLOBENZAPRINE HCL 5 MG PO TABS: 5.0000 mg | ORAL_TABLET | Freq: Every evening | ORAL | 0 refills | Status: AC | PRN

## 2023-02-16 MED ORDER — NAPROXEN 500 MG PO TABS: 500.0000 mg | ORAL_TABLET | Freq: Two times a day (BID) | ORAL | 0 refills | Status: DC

## 2023-02-16 MED ORDER — HYDROCHLOROTHIAZIDE 25 MG PO TABS: 25.0000 mg | ORAL_TABLET | Freq: Every day | ORAL | 0 refills | Status: AC

## 2023-02-16 MED ORDER — CEPHALEXIN 500 MG PO CAPS: 500.0000 mg | ORAL_CAPSULE | Freq: Three times a day (TID) | ORAL | 0 refills | Status: DC

## 2023-02-16 NOTE — ED Provider Notes (Signed)
[facility]  Note:  This document was prepared using Conservation officer, historic buildings and may include unintentional dictation errors.  MRN: [id number] DOB: 05-08-1970  Subjective:   Bailey Burke is a 52 y.o. female presenting for 2-week history of persistent urinary frequency, intermittent dysuria, not having bilateral low back/flank pains that are radiating into the hips and thighs.  Patient has not been drinking water very well.  Has been drinking soda and cranberry juice.  Has a 3 of kidney stones that required procedural intervention.  Last episode was many years ago.  No fall, trauma, numbness or tingling, saddle paresthesia, changes to bowel or urinary habits, radicular symptoms.   No current facility-administered medications for this encounter.  Current Outpatient Medications:    acyclovir (ZOVIRAX) 400 MG tablet, Take by mouth., Disp: , Rfl:    atorvastatin (LIPITOR) 20 MG tablet, Take by mouth., Disp: , Rfl:    Calcium Carbonate-Vit D-Min (CALCIUM 600+D PLUS MINERALS) 600-400 MG-UNIT TABS, Take 1 tablet by mouth daily., Disp: , Rfl:    carvedilol (COREG) 6.25 MG tablet, TAKE ONE TABLET BY MOUTH 2 TIMES DAILY., Disp: , Rfl:    potassium chloride (KLOR-CON M) 10 MEQ tablet, Take by mouth., Disp: , Rfl:    amphetamine-dextroamphetamine (ADDERALL XR) 20 MG 24 hr capsule, Take 20 mg by mouth daily., Disp: , Rfl:    buPROPion (WELLBUTRIN XL) 150 MG 24 hr tablet, Take 150 mg by mouth daily., Disp: , Rfl:    hydrochlorothiazide (HYDRODIURIL) 25 MG tablet, Take 25 mg by mouth daily., Disp: , Rfl:    ondansetron (ZOFRAN ODT) 8 MG disintegrating tablet, Take 1 tablet (8 mg total) by mouth every 8 (eight) hours as needed for nausea or vomiting., Disp: 8 tablet, Rfl: 0   promethazine-codeine (PHENERGAN WITH CODEINE) 6.25-10 MG/5ML syrup, Take 5-10 mLs by mouth every 6 (six) hours as needed. (Patient not taking: Reported on 01/05/2015), Disp: 120 mL, Rfl: 0    Allergies  Allergen Reactions   Bactrim [Sulfamethoxazole-Trimethoprim] Hives   Latex     Past Medical History:  Diagnosis Date   Anxiety    Depression    Diabetes mellitus without complication (HCC)    Hypertension    Kidney stone    per pt     Past Surgical History:  Procedure Laterality Date   DILATION AND CURETTAGE OF UTERUS     per pt   KIDNEY STONE SURGERY      Family History  Problem Relation Age of Onset   Cancer Father    Diabetes Father    Cancer Maternal Grandmother    Heart disease Maternal Grandfather    Hyperlipidemia Maternal Grandfather    Stroke Maternal Grandfather     Social History   Tobacco Use   Smoking status: Never   Smokeless tobacco: Never  Vaping Use   Vaping status: Never Used  Substance Use Topics   Alcohol use: Yes    Comment: occ   Drug use: Not Currently    ROS   Objective:   Vitals: BP (!) 154/89 (BP Location: Left Arm)   Pulse 87   Temp 98.1 F (36.7 C) (Oral)   Resp 16   LMP 12/14/2014   SpO2 95%   Physical Exam Constitutional:      General: She is not in acute distress.    Appearance: Normal appearance. She is well-developed. She is not ill-appearing, toxic-appearing or diaphoretic.  HENT:     Head: Normocephalic and atraumatic.  Nose: Nose normal.     Mouth/Throat:     Mouth: Mucous membranes are moist.  Eyes:     General: No scleral icterus.       Right eye: No discharge.        Left eye: No discharge.     Extraocular Movements: Extraocular movements intact.     Conjunctiva/sclera: Conjunctivae normal.  Cardiovascular:     Rate and Rhythm: Normal rate.  Pulmonary:     Effort: Pulmonary effort is normal.  Abdominal:     General: Bowel sounds are normal. There is no distension.     Palpations: Abdomen is soft. There is no mass.     Tenderness: There is no abdominal tenderness. There is no right CVA tenderness, left CVA tenderness, guarding or rebound.  Musculoskeletal:     Lumbar back:  Spasms and tenderness (over areas outlined) present. No swelling, edema, deformity, signs of trauma, lacerations or bony tenderness. Normal range of motion. Negative right straight leg raise test and negative left straight leg raise test. No scoliosis.       Back:  Skin:    General: Skin is warm and dry.  Neurological:     General: No focal deficit present.     Mental Status: She is alert and oriented to person, place, and time.  Psychiatric:        Mood and Affect: Mood normal.        Behavior: Behavior normal.        Thought Content: Thought content normal.        Judgment: Judgment normal.     Results for orders placed or performed during the hospital encounter of 02/16/23 (from the past 24 hours)  POCT urinalysis dipstick     Status: Abnormal   Collection Time: 02/16/23 10:18 AM  Result Value Ref Range   Color, UA yellow yellow   Clarity, UA turbid (A) clear   Glucose, UA negative negative mg/dL   Bilirubin, UA negative negative   Ketones, POC UA negative negative mg/dL   Spec Grav, UA 1.610 9.604 - 1.025   Blood, UA moderate (A) negative   pH, UA 6.5 5.0 - 8.0   Protein Ur, POC =30 (A) negative mg/dL   Urobilinogen, UA 0.2 0.2 or 1.0 E.U./dL   Nitrite, UA Negative Negative   Leukocytes, UA Large (3+) (A) Negative    Assessment and Plan :   PDMP not reviewed this encounter.  1. Acute cystitis with hematuria   2. Dysuria   3. Acute bilateral low back pain with bilateral sciatica    X-ray over-read was pending at time of discharge, recommended follow up with only abnormal results. Otherwise will not call for negative over-read. Patient was in agreement. Start Keflex to cover for acute cystitis, urine culture pending.  Recommended aggressive hydration, limiting urinary irritants. Counseled patient on potential for adverse effects with medications prescribed/recommended today, ER and return-to-clinic precautions discussed, patient verbalized understanding.    Wallis Bamberg,  PA-C 02/16/23 1127

## 2023-02-16 NOTE — ED Triage Notes (Addendum)
Pt c/o pain "from the waist down-both sides" x 1 week-worse on right side-states urinary freq x 2+ weeks-NAD-slow gait

## 2023-02-16 NOTE — Discharge Instructions (Addendum)
Please start cephalexin to address an urinary tract infection. Make sure you hydrate very well with plain water and a quantity of 80 ounces of water a day.  Please limit drinks that are considered urinary irritants such as soda, sweet tea, coffee, energy drinks, alcohol.  These can worsen your urinary and genital symptoms but also be the source of them.  I will let you know about your urine culture results through MyChart to see if we need to prescribe or change your antibiotics based off of those results. Use naproxen for back pain and inflammation and cyclobenzaprine as a muscle relaxant. Will update you on your x-ray results later.    For diabetes or elevated blood sugar, please make sure you are limiting and avoiding starchy, carbohydrate foods like pasta, breads, sweet breads, pastry, rice, potatoes, desserts. These foods can elevate your blood sugar. Also, limit and avoid drinks that contain a lot of sugar such as sodas, sweet teas, fruit juices.  Drinking plain water will be much more helpful, try 64 ounces of water daily.  It is okay to flavor your water naturally by cutting cucumber, lemon, mint or lime, placing it in a picture with water and drinking it over a period of 24-48 hours as long as it remains refrigerated.  For elevated blood pressure, make sure you are monitoring salt in your diet.  Do not eat restaurant foods and limit processed foods at home. I highly recommend you prepare and cook your own foods at home.  Processed foods include things like frozen meals, pre-seasoned meats and dinners, deli meats, canned foods as these foods contain a high amount of sodium/salt.  Make sure you are paying attention to sodium labels on foods you buy at the grocery store. Buy your spices separately such as garlic powder, onion powder, cumin, cayenne, parsley flakes so that you can avoid seasonings that contain salt. However, salt-free seasonings are available and can be used, an example is Mrs. Dash and  includes a lot of different mixtures that do not contain salt.  Lastly, when cooking using oils that are healthier for you is important. This includes olive oil, avocado oil, canola oil. We have discussed a lot of foods to avoid but below is a list of foods that can be very healthy to use in your diet whether it is for diabetes, cholesterol, high blood pressure, or in general healthy eating.  Salads - kale, spinach, cabbage, spring mix, arugula Fruits - avocadoes, berries (blueberries, raspberries, blackberries), apples, oranges, pomegranate, grapefruit, kiwi Vegetables - asparagus, cauliflower, broccoli, green beans, brussel sprouts, bell peppers, beets; stay away from or limit starchy vegetables like potatoes, carrots, peas Other general foods - kidney beans, egg whites, almonds, walnuts, sunflower seeds, pumpkin seeds, fat free yogurt, almond milk, flax seeds, quinoa, oats  Meat - It is better to eat lean meats and limit your red meat including pork to once a week.  Wild caught fish, chicken breast are good options as they tend to be leaner sources of good protein. Still be mindful of the sodium labels for the meats you buy.  DO NOT EAT ANY FOODS ON THIS LIST THAT YOU ARE ALLERGIC TO. For more specific needs, I highly recommend consulting a dietician or nutritionist but this can definitely be a good starting point.

## 2023-02-18 LAB — URINE CULTURE: Culture: 40000 — AB

## 2023-02-28 ENCOUNTER — Encounter: Payer: Self-pay | Admitting: Emergency Medicine

## 2023-02-28 ENCOUNTER — Ambulatory Visit
Admission: EM | Admit: 2023-02-28 | Discharge: 2023-02-28 | Disposition: A | Discharge status: Home / Self Care | Payer: PRIVATE HEALTH INSURANCE | Attending: Internal Medicine | Admitting: Internal Medicine

## 2023-02-28 DIAGNOSIS — M2550 Pain in unspecified joint: Secondary | ICD-10-CM

## 2023-02-28 DIAGNOSIS — E119 Type 2 diabetes mellitus without complications: Secondary | ICD-10-CM

## 2023-02-28 DIAGNOSIS — I10 Essential (primary) hypertension: Secondary | ICD-10-CM | POA: Diagnosis not present

## 2023-02-28 LAB — POCT FASTING CBG KUC MANUAL ENTRY: POCT Glucose (KUC): 155 mg/dL — AB (ref 70–99)

## 2023-02-28 MED ORDER — PREDNISONE 20 MG PO TABS: 40.0000 mg | ORAL_TABLET | Freq: Every day | ORAL | 0 refills | Status: AC

## 2023-02-28 MED ORDER — CARVEDILOL 6.25 MG PO TABS: 6.2500 mg | ORAL_TABLET | Freq: Two times a day (BID) | ORAL | 0 refills | Status: AC

## 2023-02-28 NOTE — ED Triage Notes (Signed)
Patient c/o pain in joints, low back pain and bilateral leg/ankle swelling x 3-4 weeks.  Patient seen on 02/16/2023, given muscle relaxers and antibiotic for a UTI.  Urine is still concentrated, she is drinking plenty of water.  Unable to see PCP until February.

## 2023-02-28 NOTE — ED Provider Notes (Addendum)
[facility]    CSN: [id number] Arrival date & time: 02/28/23  1336      History   Chief Complaint Chief Complaint  Patient presents with   Back Pain    HPI Bailey Burke is a 52 y.o. female presents for joint pain.  Patient reports 2 to 3 weeks of joint pain in her ankles, hips, hands.  States the areas feel tight and swollen.  No redness, warmth, rashes.  No fevers or chills.  She was treated a couple weeks ago for UTI with resolution of symptoms.  Did have a lumbar spine x-ray at that time that was normal.  Denies any history of arthritis/RA, lupus, MS, gout.  Is currently in between PCPs.  States she has a history of diabetes that was newly diagnosed but she has not been treated for this.  Denies any chest pain, shortness of breath, orthopnea.  No other concerns at this time.   Back Pain   Past Medical History:  Diagnosis Date   Anxiety    Depression    Diabetes mellitus without complication (HCC)    Hypertension    Kidney stone    per pt    There are no active problems to display for this patient.   Past Surgical History:  Procedure Laterality Date   DILATION AND CURETTAGE OF UTERUS     per pt   KIDNEY STONE SURGERY      OB History   No obstetric history on file.      Home Medications    Prior to Admission medications   Medication Sig Start Date End Date Taking? Authorizing Provider  acyclovir (ZOVIRAX) 400 MG tablet Take by mouth. 12/12/20  Yes [provider]  amphetamine-dextroamphetamine (ADDERALL XR) 20 MG 24 hr capsule Take 20 mg by mouth daily.   Yes [provider]  atorvastatin (LIPITOR) 20 MG tablet Take by mouth. 12/01/16  Yes [provider]  buPROPion (WELLBUTRIN XL) 150 MG 24 hr tablet Take 150 mg by mouth daily.   Yes [provider]  Calcium Carbonate-Vit D-Min (CALCIUM 600+D PLUS MINERALS) 600-400 MG-UNIT TABS Take 1 tablet by mouth daily. 01/03/15  Yes [provider]   carvedilol (COREG) 6.25 MG tablet Take 1 tablet (6.25 mg total) by mouth 2 (two) times daily with a meal. 02/28/23  Yes Radford Pax, NP  cyclobenzaprine (FLEXERIL) 5 MG tablet Take 1 tablet (5 mg total) by mouth at bedtime as needed. 02/16/23  Yes Wallis Bamberg, PA-C  hydrochlorothiazide (HYDRODIURIL) 25 MG tablet Take 1 tablet (25 mg total) by mouth daily. 02/16/23  Yes Wallis Bamberg, PA-C  naproxen (NAPROSYN) 500 MG tablet Take 1 tablet (500 mg total) by mouth 2 (two) times daily with a meal. 02/16/23  Yes Wallis Bamberg, PA-C  ondansetron (ZOFRAN ODT) 8 MG disintegrating tablet Take 1 tablet (8 mg total) by mouth every 8 (eight) hours as needed for nausea or vomiting. 01/05/15  Yes Cathren Laine, MD  potassium chloride (KLOR-CON M) 10 MEQ tablet Take by mouth. 12/12/20  Yes [provider]  predniSONE (DELTASONE) 20 MG tablet Take 2 tablets (40 mg total) by mouth daily with breakfast for 5 days. 02/28/23 03/05/23 Yes Radford Pax, NP  cephALEXin (KEFLEX) 500 MG capsule Take 1 capsule (500 mg total) by mouth 3 (three) times daily. 02/16/23   Wallis Bamberg, PA-C  promethazine-codeine (PHENERGAN WITH CODEINE) 6.25-10 MG/5ML syrup Take 5-10 mLs by mouth every 6 (six) hours as needed. Patient not taking:  Reported on 01/05/2015 05/11/13   Carmelina Dane, MD    Family History Family History  Problem Relation Age of Onset   Hypertension Mother    Neuropathy Mother    ADD / ADHD Mother    Congestive Heart Failure Mother    Emphysema Mother    Cancer Father    Diabetes Father    Cancer Maternal Grandmother    Heart disease Maternal Grandfather    Hyperlipidemia Maternal Grandfather    Stroke Maternal Grandfather     Social History Social History   Tobacco Use   Smoking status: Never   Smokeless tobacco: Never  Vaping Use   Vaping status: Never Used  Substance Use Topics   Alcohol use: Yes    Comment: occ   Drug use: Not Currently     Allergies   Bactrim  [sulfamethoxazole-trimethoprim] and Latex   Review of Systems Review of Systems  Musculoskeletal:  Positive for arthralgias.     Physical Exam Triage Vital Signs ED Triage Vitals  Encounter Vitals Group     BP 02/28/23 1448 (!) 151/97     Systolic BP Percentile --      Diastolic BP Percentile --      Pulse Rate 02/28/23 1448 94     Resp 02/28/23 1448 18     Temp 02/28/23 1448 98.2 F (36.8 C)     Temp Source 02/28/23 1448 Oral     SpO2 02/28/23 1448 95 %     Weight 02/28/23 1450 195 lb (88.5 kg)     Height 02/28/23 1450 5\' 3"  (1.6 m)     Head Circumference --      Peak Flow --      Pain Score 02/28/23 1450 8     Pain Loc --      Pain Education --      Exclude from Growth Chart --    No data found.  Updated Vital Signs BP (!) 151/97 (BP Location: Right Arm)   Pulse 94   Temp 98.2 F (36.8 C) (Oral)   Resp 18   Ht 5\' 3"  (1.6 m)   Wt 195 lb (88.5 kg)   LMP 12/14/2014   SpO2 95%   BMI 34.54 kg/m   Visual Acuity Right Eye Distance:   Left Eye Distance:   Bilateral Distance:    Right Eye Near:   Left Eye Near:    Bilateral Near:     Physical Exam Vitals and nursing note reviewed.  Constitutional:      General: She is not in acute distress.    Appearance: Normal appearance. She is not ill-appearing.  HENT:     Head: Normocephalic and atraumatic.  Eyes:     Pupils: Pupils are equal, round, and reactive to light.  Cardiovascular:     Rate and Rhythm: Normal rate and regular rhythm.     Heart sounds: Normal heart sounds.  Pulmonary:     Effort: Pulmonary effort is normal.     Breath sounds: Normal breath sounds.  Musculoskeletal:     Comments: There is no erythema or warmth of the ankles, low back/hips, hands.  No pitting edema.  Nontender to palpation.  Cap refill +2 in all digits.  Skin:    General: Skin is warm and dry.  Neurological:     General: No focal deficit present.     Mental Status: She is alert and oriented to person, place, and time.   Psychiatric:  Mood and Affect: Mood normal.        Behavior: Behavior normal.      UC Treatments / Results  Labs (all labs ordered are listed, but only abnormal results are displayed) Labs Reviewed  POCT FASTING CBG [facility] MANUAL ENTRY - Abnormal; Notable for the following components:      Result Value   POCT Glucose ([facility]) 155 (*)    All other components within normal limits  COMPREHENSIVE METABOLIC PANEL  CBC    EKG   Radiology No results found.  Procedures Procedures (including critical care time)  Medications Ordered in UC Medications - No data to display  Initial Impression / Assessment and Plan / UC Course  I have reviewed the triage vital signs and the nursing notes.  Pertinent labs & imaging results that were available during my care of the patient were reviewed by me and considered in my medical decision making (see chart for details).     Reviewed exam and symptoms with patient.  No red flags.  Unclear cause of arthralgias.  Will do CBC and BMP and start a course of prednisone.  Contact patient for any positive results of blood work.  Refilled patient's carvedilol at request.  Fasting point-of-care blood sugar is 155.  Nursing staff was able to set patient up for an appointment for next week with a new PCP.  Strict ER precautions reviewed and patient verbalized understanding. Final Clinical Impressions(s) / UC Diagnoses   Final diagnoses:  Arthralgia, unspecified joint  Type 2 diabetes mellitus without complication, without long-term current use of insulin (HCC)  Hypertension, unspecified type     Discharge Instructions      The clinic will contact you with results of the blood work done today if positive.  Start prednisone daily for 5 days.  Please follow-up with your PCP at your scheduled appointment.  Please go to the ER if you develop any worsening symptoms.  I hope you feel better soon!     ED Prescriptions     Medication Sig Dispense Auth.  Provider   carvedilol (COREG) 6.25 MG tablet Take 1 tablet (6.25 mg total) by mouth 2 (two) times daily with a meal. 60 tablet Radford Pax, NP   predniSONE (DELTASONE) 20 MG tablet Take 2 tablets (40 mg total) by mouth daily with breakfast for 5 days. 10 tablet Radford Pax, NP      PDMP not reviewed this encounter.   Radford Pax, NP 02/28/23 1528    Radford Pax, NP 02/28/23 [phone]

## 2023-02-28 NOTE — Discharge Instructions (Addendum)
The clinic will contact you with results of the blood work done today if positive.  Start prednisone daily for 5 days.  Please follow-up with your PCP at your scheduled appointment.  Please go to the ER if you develop any worsening symptoms.  I hope you feel better soon!

## 2023-03-01 LAB — COMPREHENSIVE METABOLIC PANEL
ALT: 18 [IU]/L (ref 0–32)
AST: 17 [IU]/L (ref 0–40)
Albumin: 3.9 g/dL (ref 3.9–4.9)
Alkaline Phosphatase: 162 [IU]/L — ABNORMAL HIGH (ref 44–121)
BUN/Creatinine Ratio: 14 (ref 9–23)
BUN: 13 mg/dL (ref 6–24)
Bilirubin Total: 0.4 mg/dL (ref 0.0–1.2)
CO2: 25 mmol/L (ref 20–29)
Calcium: 9.1 mg/dL (ref 8.7–10.2)
Chloride: 100 mmol/L (ref 96–106)
Creatinine, Ser: 0.94 mg/dL (ref 0.57–1.00)
Globulin, Total: 3.1 g/dL (ref 1.5–4.5)
Glucose: 163 mg/dL — ABNORMAL HIGH (ref 70–99)
Potassium: 3.6 mmol/L (ref 3.5–5.2)
Sodium: 140 mmol/L (ref 134–144)
Total Protein: 7 g/dL (ref 6.0–8.5)
eGFR: 74 mL/min/{1.73_m2} (ref >59)

## 2023-03-01 LAB — CBC
Hematocrit: 39.5 % (ref 34.0–46.6)
Hemoglobin: 13.1 g/dL (ref 11.1–15.9)
MCH: 28.6 pg (ref 26.6–33.0)
MCHC: 33.2 g/dL (ref 31.5–35.7)
MCV: 86 fL (ref 79–97)
Platelets: 444 10*3/uL (ref 150–450)
RBC: 4.58 x10E6/uL (ref 3.77–5.28)
RDW: 12.2 % (ref 11.7–15.4)
WBC: 8.7 10*3/uL (ref 3.4–10.8)

## 2023-03-04 ENCOUNTER — Ambulatory Visit (INDEPENDENT_AMBULATORY_CARE_PROVIDER_SITE_OTHER): Payer: PRIVATE HEALTH INSURANCE | Admitting: Family Medicine

## 2023-03-04 ENCOUNTER — Encounter: Payer: Self-pay | Admitting: Family Medicine

## 2023-03-04 VITALS — BP 127/77 | HR 95 | Temp 97.9°F | Resp 18 | Ht 63.0 in | Wt 194.5 lb

## 2023-03-04 DIAGNOSIS — E119 Type 2 diabetes mellitus without complications: Secondary | ICD-10-CM

## 2023-03-04 DIAGNOSIS — N3000 Acute cystitis without hematuria: Secondary | ICD-10-CM

## 2023-03-04 DIAGNOSIS — Z5181 Encounter for therapeutic drug level monitoring: Secondary | ICD-10-CM

## 2023-03-04 DIAGNOSIS — F909 Attention-deficit hyperactivity disorder, unspecified type: Secondary | ICD-10-CM

## 2023-03-04 DIAGNOSIS — F339 Major depressive disorder, recurrent, unspecified: Secondary | ICD-10-CM

## 2023-03-04 DIAGNOSIS — Z79899 Other long term (current) drug therapy: Secondary | ICD-10-CM

## 2023-03-04 DIAGNOSIS — Z1329 Encounter for screening for other suspected endocrine disorder: Secondary | ICD-10-CM

## 2023-03-04 DIAGNOSIS — R600 Localized edema: Secondary | ICD-10-CM

## 2023-03-04 DIAGNOSIS — Z7689 Persons encountering health services in other specified circumstances: Secondary | ICD-10-CM

## 2023-03-04 MED ORDER — BUPROPION HCL ER (XL) 150 MG PO TB24: 150.0000 mg | ORAL_TABLET | Freq: Every day | ORAL | 0 refills | Status: AC

## 2023-03-04 MED ORDER — AMPHETAMINE-DEXTROAMPHET ER 20 MG PO CP24: 20.0000 mg | ORAL_CAPSULE | Freq: Every day | ORAL | 0 refills | Status: AC

## 2023-03-04 NOTE — Progress Notes (Signed)
 New Patient Office Visit  Subjective    Patient ID: Bailey Burke, female    DOB: 1970-11-08  Age: 53 y.o. MRN: [id number]  CC:  Chief Complaint  Patient presents with   Establish Care    Patient is here to establish care as well as follow up from Urgent care due to fluid build up in ankles and joints , UTI, and urinary incontinence has gotten worse since being treated for the UTI. Would also like to discuss being put on medication for diabetes was diagnosed but never treated. Patient states that she is having trouble with bottom of feet she states that her feet feel inflamed making it hard to walk.    HPI Bailey Burke presents to establish care. Pt is new to me.  Pt reports her PCP was [facility] and she had cancellations. She was dismissed from the practice.   She reports before she left her previous practice, she was diagnosed with diabetes. She has never had treatment for this. She reports she had UTI diagnosed a few weeks ago. She also reported arthralgias all over along with swelling in her ankles. She is unsure if all this is related. She was given Keflex , Flexeril  and Naproxen  from her first visit in the urgent care. This didn't help so she went back to urgent care and was given Prednisone . She still has some joint pain but the Prednisone  helped. She still feels like her swelling is still present.   She reports a hx of urinary incontinence in the last month with dark urine. She is trying to drink a lot of water. Last A1c was 6.8 in June 2024.  She has hx of depression and was taking Wellbutrin  150mg  daily. She needs this refilled. Flowsheet Row Office Visit from 03/04/2023 in [facility]  PHQ-9 Total Score 5       She has Adderall 20mg  daily for ADHD. She has been out of this for the last month. Needs this refilled. She has HTN and taking hydrochlorothiazide  25 mg daily + KCL 10 mEq and Coreg  6.25mg  BID. She has HLD and  using Atorvastatin 20mg  daily. She has hx of cold sores and uses Acyclovir 400mg  daily prn.   Outpatient Encounter Medications as of 03/04/2023  Medication Sig   acyclovir (ZOVIRAX) 400 MG tablet Take by mouth.   atorvastatin (LIPITOR) 20 MG tablet Take by mouth.   Calcium Carbonate-Vit D-Min (CALCIUM 600+D PLUS MINERALS) 600-400 MG-UNIT TABS Take 1 tablet by mouth daily.   carvedilol  (COREG ) 6.25 MG tablet Take 1 tablet (6.25 mg total) by mouth 2 (two) times daily with a meal.   cyclobenzaprine  (FLEXERIL ) 5 MG tablet Take 1 tablet (5 mg total) by mouth at bedtime as needed.   hydrochlorothiazide  (HYDRODIURIL ) 25 MG tablet Take 1 tablet (25 mg total) by mouth daily.   ibuprofen (ADVIL) 200 MG tablet Take by mouth.   potassium chloride (KLOR-CON M) 10 MEQ tablet Take by mouth.   predniSONE  (DELTASONE ) 20 MG tablet Take 2 tablets (40 mg total) by mouth daily with breakfast for 5 days.   amphetamine -dextroamphetamine (ADDERALL XR) 20 MG 24 hr capsule Take 20 mg by mouth daily. (Patient not taking: Reported on 03/04/2023)   buPROPion  (WELLBUTRIN  XL) 150 MG 24 hr tablet Take 150 mg by mouth daily. (Patient not taking: Reported on 03/04/2023)   [DISCONTINUED] cephALEXin  (KEFLEX ) 500 MG capsule Take 1 capsule (500 mg total) by mouth 3 (three) times daily.   [DISCONTINUED] naproxen  (  NAPROSYN ) 500 MG tablet Take 1 tablet (500 mg total) by mouth 2 (two) times daily with a meal.   [DISCONTINUED] ondansetron  (ZOFRAN  ODT) 8 MG disintegrating tablet Take 1 tablet (8 mg total) by mouth every 8 (eight) hours as needed for nausea or vomiting.   [DISCONTINUED] promethazine -codeine  (PHENERGAN  WITH CODEINE ) 6.25-10 MG/5ML syrup Take 5-10 mLs by mouth every 6 (six) hours as needed. (Patient not taking: Reported on 01/05/2015)   No facility-administered encounter medications on file as of 03/04/2023.    Past Medical History:  Diagnosis Date   Anxiety    Depression    Diabetes mellitus without complication (HCC)     Hypertension    Kidney stone    per pt    Past Surgical History:  Procedure Laterality Date   DILATION AND CURETTAGE OF UTERUS     per pt   KIDNEY STONE SURGERY      Family History  Problem Relation Age of Onset   Hypertension Mother    Neuropathy Mother    ADD / ADHD Mother    Congestive Heart Failure Mother    Emphysema Mother    Cancer Father    Diabetes Father    Cancer Maternal Grandmother    Heart disease Maternal Grandfather    Hyperlipidemia Maternal Grandfather    Stroke Maternal Grandfather     Social History   Socioeconomic History   Marital status: Single    Spouse name: Not on file   Number of children: Not on file   Years of education: Not on file   Highest education level: Not on file  Occupational History   Not on file  Tobacco Use   Smoking status: Never    Passive exposure: Never   Smokeless tobacco: Never  Vaping Use   Vaping status: Never Used  Substance and Sexual Activity   Alcohol use: Yes    Comment: occ   Drug use: Not Currently   Sexual activity: Not on file  Other Topics Concern   Not on file  Social History Narrative   Not on file   Social Drivers of Health   Financial Resource Strain: Low Risk  (08/25/2022)   Received [facility]   Overall Financial Resource Strain (CARDIA)    Difficulty of Paying Living Expenses: Not very hard  Food Insecurity: No Food Insecurity (08/25/2022)   Received from [facility]   Hunger Vital Sign    Worried About Running Out of Food in the Last Year: Never true    Ran Out of Food in the Last Year: Never true  Transportation Needs: No Transportation Needs (08/25/2022)   Received from [facility] - Transportation    Lack of Transportation (Medical): No    Lack of Transportation (Non-Medical): No  Physical Activity: Insufficiently Active (04/15/2021)   Received from [facility]   Exercise Vital Sign    Days of Exercise per Week: 2 days    Minutes of Exercise per Session:  30 min  Stress: Stress Concern Present (04/15/2021)   Received from [facility] - Occupational Stress Questionnaire    Feeling of Stress : Very much  Social Connections: Unknown (07/14/2021)   Received from [facility]   Social Network    Social Network: Not on file  Recent Concern: Social Connections - Moderately Isolated (04/15/2021)   Received from [facility]   Social Connection and Isolation Panel [NHANES]    Frequency of Communication with Friends  and Family: More than three times a week    Frequency of Social Gatherings with Friends and Family: Twice a week    Attends Religious Services: More than 4 times per year    Active Member of [employer] or Organizations: No    Attends Banker Meetings: Never    Marital Status: Never married  Intimate Partner Violence: Unknown (06/05/2021)   Received from [facility]   HITS    Physically Hurt: Not on file    Insult or Talk Down To: Not on file    Threaten Physical Harm: Not on file    Scream or Curse: Not on file    Review of Systems  Cardiovascular:  Positive for leg swelling.  Genitourinary:        Dark urine, urinary incontinence  Musculoskeletal:  Positive for joint pain.  All other systems reviewed and are negative.      Objective    BP 127/77   Pulse 95   Temp 97.9 F (36.6 C) (Oral)   Resp 18   Ht 5' 3 (1.6 m)   Wt 194 lb 8 oz (88.2 kg)   LMP 12/14/2014   SpO2 96%   BMI 34.45 kg/m   Physical Exam Vitals and nursing note reviewed.  Constitutional:      Appearance: Normal appearance. She is normal weight.  HENT:     Head: Normocephalic and atraumatic.     Right Ear: External ear normal.     Left Ear: Tympanic membrane, ear canal and external ear normal.     Nose: Nose normal.     Mouth/Throat:     Mouth: Mucous membranes are moist.     Pharynx: Oropharynx is clear.  Eyes:     Conjunctiva/sclera: Conjunctivae normal.     Pupils: Pupils are equal,  round, and reactive to light.  Cardiovascular:     Rate and Rhythm: Normal rate and regular rhythm.     Pulses: Normal pulses.     Heart sounds: Normal heart sounds.  Pulmonary:     Effort: Pulmonary effort is normal.     Breath sounds: Normal breath sounds.  Skin:    General: Skin is warm.     Capillary Refill: Capillary refill takes less than 2 seconds.  Neurological:     General: No focal deficit present.     Mental Status: She is alert and oriented to person, place, and time. Mental status is at baseline.  Psychiatric:        Mood and Affect: Mood normal.        Behavior: Behavior normal.        Thought Content: Thought content normal.        Judgment: Judgment normal.   Last hemoglobin A1c No results found for: HGBA1C      Assessment & Plan:   Problem List Items Addressed This Visit   None Encounter to establish care with new doctor  Attention deficit hyperactivity disorder (ADHD), unspecified ADHD type -     Amphetamine -Dextroamphet ER; Take 1 capsule (20 mg total) by mouth daily.  Dispense: 30 capsule; Refill: 0  Depression, recurrent (HCC) -     buPROPion  HCl ER (XL); Take 1 tablet (150 mg total) by mouth daily.  Dispense: 30 tablet; Refill: 0  Diabetes mellitus without complication (HCC) -     Microalbumin / creatinine urine ratio -     Hemoglobin A1c  Peripheral edema -     TSH -     T4,  free  Screening for thyroid disorder -     TSH -     T4, free  Medication monitoring encounter -     ToxASSURE Select 13 (MW), Urine  Controlled substance agreement signed -     ToxASSURE Select 13 (MW), Urine  Acute cystitis without hematuria -     Urine Culture   Pt with chronic ADHD and Depression. Needs refills on her chronic medicines which was refilled. Adderall 20 mg and Wellbutrin  150 mg daily. CSA and UDS done today.  Pt diabetic as of June 2024 with A1c of 6.8. Never received treatment so will recheck A1c today along with urine micro. Recently  diagnosed with UTI. Will recheck urine culture to assure resolution of UTI due to urinary symptoms although this could be related to hyperglycemia? Screening thyroid due to peripheral edema.  No follow-ups on file.   Torrence CINDERELLA Barrier, MD

## 2023-03-05 LAB — MICROALBUMIN / CREATININE URINE RATIO
Creatinine, Urine: 39.9 mg/dL
Microalb/Creat Ratio: <8 mg/g{creat} (ref 0–29)
Microalbumin, Urine: <3 ug/mL

## 2023-03-05 LAB — HEMOGLOBIN A1C
Est. average glucose Bld gHb Est-mCnc: 197 mg/dL
Hgb A1c MFr Bld: 8.5 % — ABNORMAL HIGH (ref 4.8–5.6)

## 2023-03-05 LAB — TSH: TSH: 0.532 u[IU]/mL (ref 0.450–4.500)

## 2023-03-05 LAB — T4, FREE: Free T4: 1.4 ng/dL (ref 0.82–1.77)

## 2023-03-06 LAB — TOXASSURE SELECT 13 (MW), URINE

## 2023-03-08 ENCOUNTER — Other Ambulatory Visit: Payer: Self-pay | Admitting: Family Medicine

## 2023-03-08 ENCOUNTER — Encounter: Payer: Self-pay | Admitting: Family Medicine

## 2023-03-08 DIAGNOSIS — E1165 Type 2 diabetes mellitus with hyperglycemia: Secondary | ICD-10-CM

## 2023-03-08 MED ORDER — METFORMIN HCL ER 500 MG PO TB24: 500.0000 mg | ORAL_TABLET | Freq: Two times a day (BID) | ORAL | 1 refills | Status: AC

## 2023-03-09 ENCOUNTER — Other Ambulatory Visit: Payer: Self-pay | Admitting: Family Medicine

## 2023-03-09 DIAGNOSIS — N3000 Acute cystitis without hematuria: Secondary | ICD-10-CM

## 2023-03-09 LAB — URINE CULTURE

## 2023-03-09 MED ORDER — CIPROFLOXACIN HCL 250 MG PO TABS: 250.0000 mg | ORAL_TABLET | Freq: Two times a day (BID) | ORAL | 0 refills | Status: AC

## 2023-03-15 ENCOUNTER — Encounter: Payer: Self-pay | Admitting: Family Medicine

## 2023-03-15 DIAGNOSIS — R600 Localized edema: Secondary | ICD-10-CM

## 2023-03-16 MED ORDER — FUROSEMIDE 20 MG PO TABS: 20.0000 mg | ORAL_TABLET | Freq: Every day | ORAL | 0 refills | Status: AC | PRN

## 2023-04-01 ENCOUNTER — Ambulatory Visit: Payer: PRIVATE HEALTH INSURANCE | Admitting: Family Medicine

## 2023-04-06 ENCOUNTER — Ambulatory Visit: Payer: Self-pay | Admitting: Family Medicine

## 2023-05-03 ENCOUNTER — Ambulatory Visit: Payer: Self-pay | Admitting: Family Medicine

## 2023-05-03 ENCOUNTER — Encounter: Payer: Self-pay | Admitting: Family Medicine
